# Patient Record
Sex: Female | Born: 1980 | Race: Black or African American | Hispanic: No | Marital: Single | Smoking: Never smoker
Health system: Southern US, Community
[De-identification: ages and names within clinical notes are randomized; demographics above are authoritative.]

## PROBLEM LIST (undated history)

## (undated) DIAGNOSIS — E119 Type 2 diabetes mellitus without complications: Secondary | ICD-10-CM

## (undated) DIAGNOSIS — I10 Essential (primary) hypertension: Secondary | ICD-10-CM

## (undated) HISTORY — DX: Morbid (severe) obesity due to excess calories: E66.01

---

## 2002-12-19 ENCOUNTER — Encounter: Admission: RE | Admit: 2002-12-19 | Discharge: 2002-12-19 | Payer: Self-pay | Admitting: Family Medicine

## 2003-01-02 ENCOUNTER — Encounter: Admission: RE | Admit: 2003-01-02 | Discharge: 2003-01-02 | Payer: Self-pay | Admitting: Family Medicine

## 2003-01-30 ENCOUNTER — Encounter: Admission: RE | Admit: 2003-01-30 | Discharge: 2003-01-30 | Payer: Self-pay | Admitting: Family Medicine

## 2006-11-28 ENCOUNTER — Ambulatory Visit (HOSPITAL_COMMUNITY): Admission: RE | Admit: 2006-11-28 | Discharge: 2006-11-28 | Payer: Self-pay | Admitting: Obstetrics and Gynecology

## 2006-12-26 ENCOUNTER — Ambulatory Visit (HOSPITAL_COMMUNITY): Admission: RE | Admit: 2006-12-26 | Discharge: 2006-12-26 | Payer: Self-pay | Admitting: Obstetrics and Gynecology

## 2006-12-26 IMAGING — US US OB FOLLOW-UP
1 series · 11 of 11 positions shown · non-contrast
Comparison: none

OBSTETRICAL ULTRASOUND:
 This ultrasound was performed in The [HOSPITAL], and the [facility] report will be stored to [REDACTED] PACS.

[Series 1: us ob follow-up · 11 of 11 slices shown]
[im 1/11]
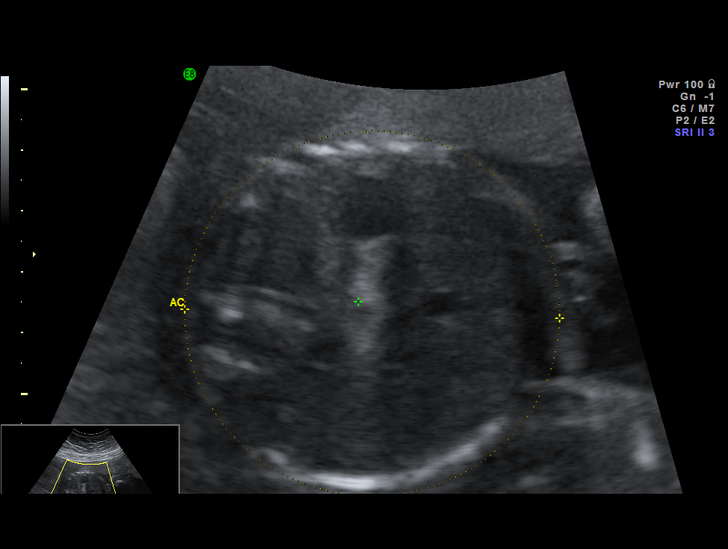
[im 2/11]
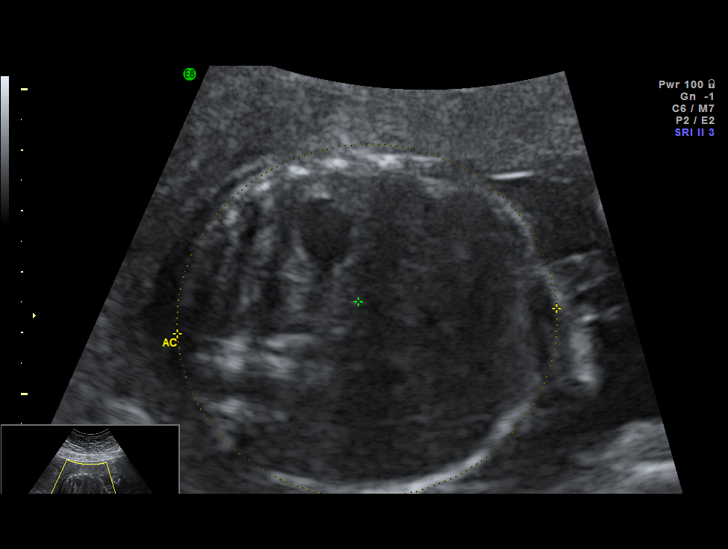
[im 3/11]
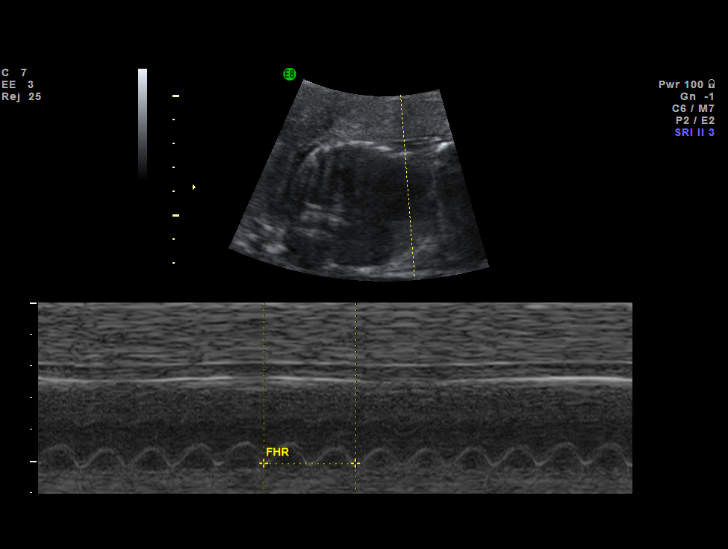
[im 4/11]
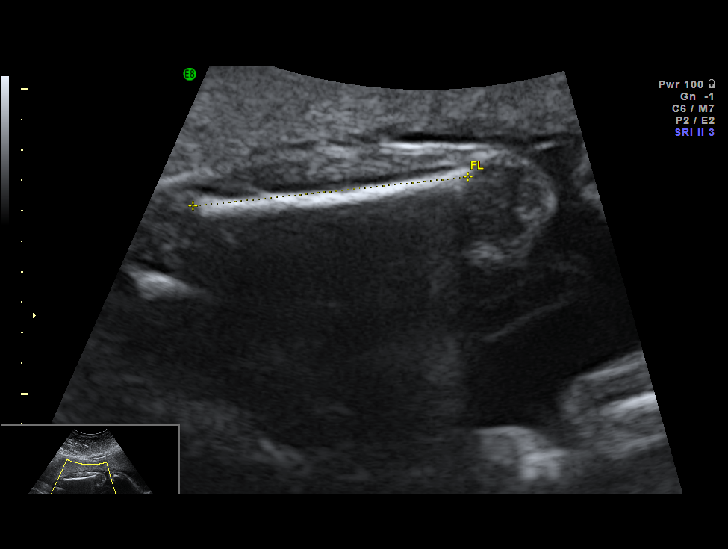
[im 5/11]
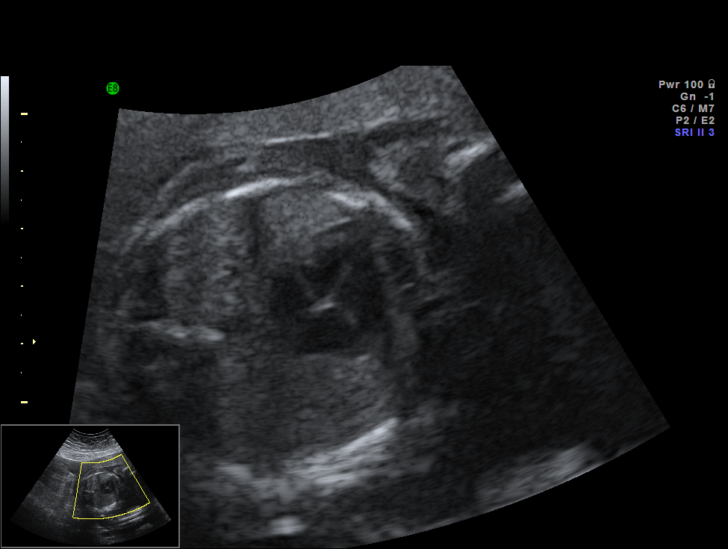
[im 6/11]
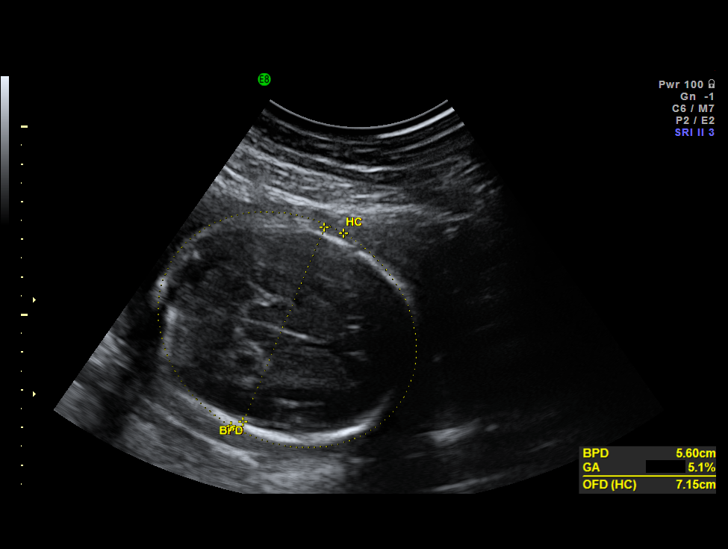
[im 7/11]
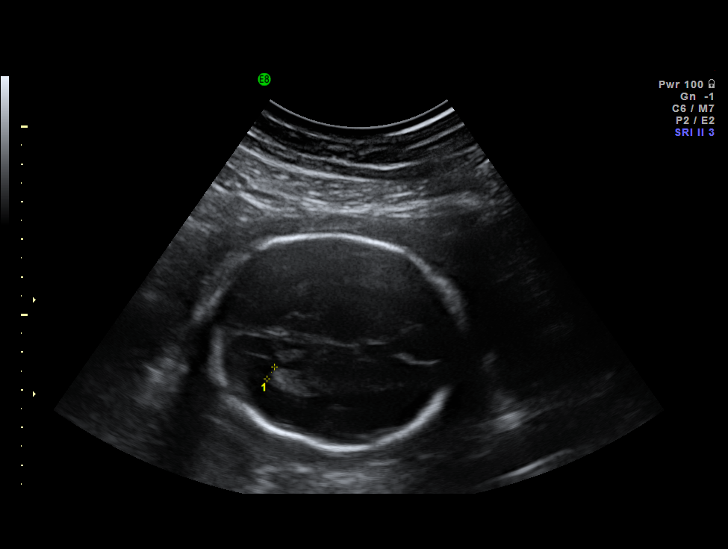
[im 8/11]
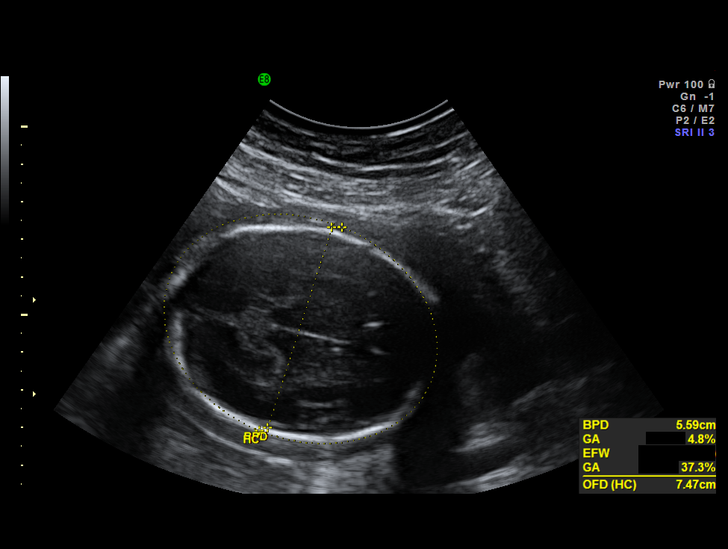
[im 9/11]
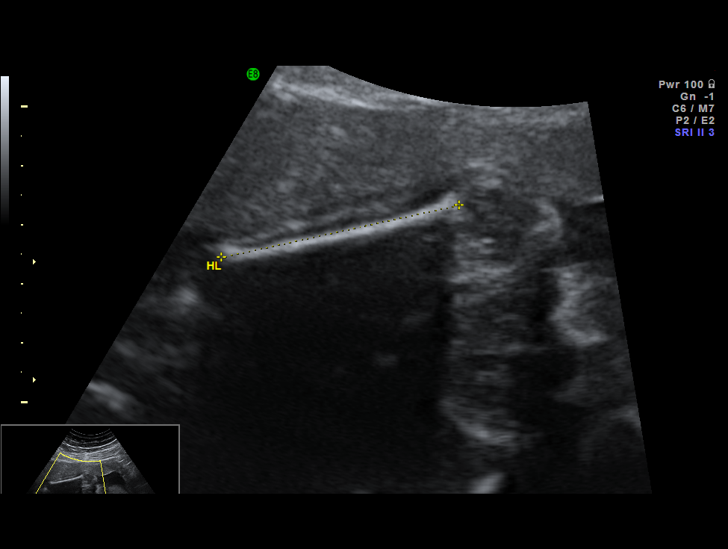
[im 10/11]
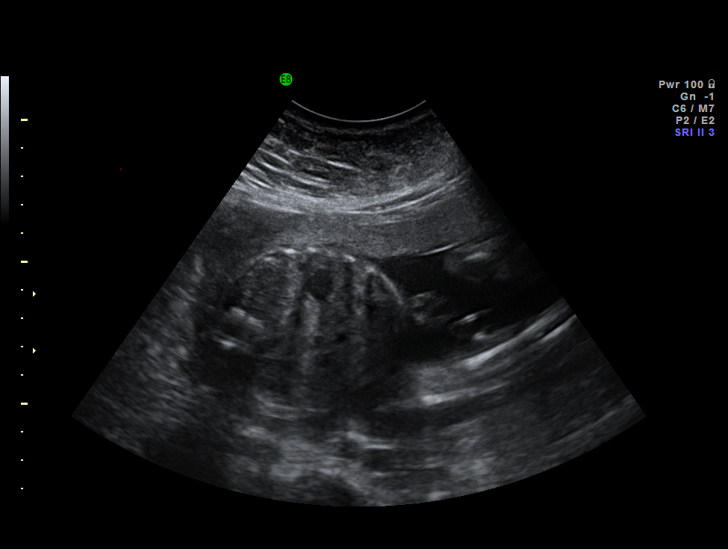
[im 11/11]
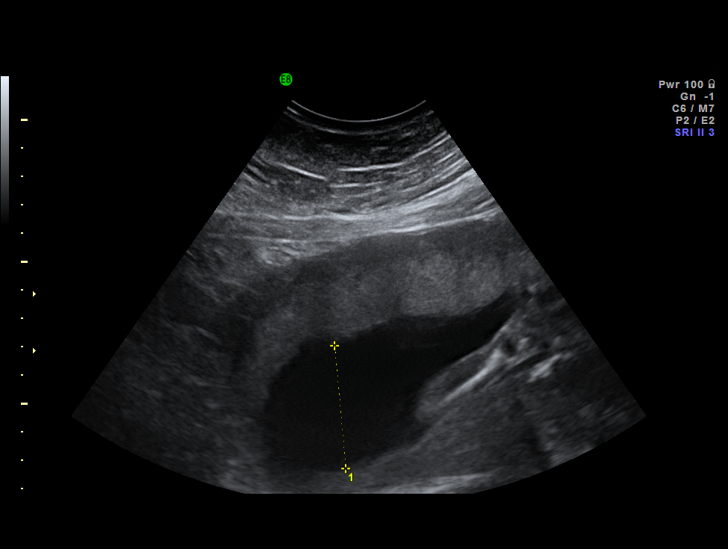

[11 of 11 positions shown; findings below may reference images not displayed]

IMPRESSION: The [facility] report has also been faxed to the ordering physician.

## 2007-01-30 ENCOUNTER — Ambulatory Visit (HOSPITAL_COMMUNITY): Admission: RE | Admit: 2007-01-30 | Discharge: 2007-01-30 | Payer: Self-pay | Admitting: Obstetrics and Gynecology

## 2007-02-20 ENCOUNTER — Ambulatory Visit (HOSPITAL_COMMUNITY): Admission: RE | Admit: 2007-02-20 | Discharge: 2007-02-20 | Payer: Self-pay | Admitting: Obstetrics and Gynecology

## 2007-02-22 ENCOUNTER — Ambulatory Visit (HOSPITAL_COMMUNITY): Admission: RE | Admit: 2007-02-22 | Discharge: 2007-02-22 | Payer: Self-pay | Admitting: Obstetrics and Gynecology

## 2007-02-26 ENCOUNTER — Ambulatory Visit (HOSPITAL_COMMUNITY): Admission: RE | Admit: 2007-02-26 | Discharge: 2007-02-26 | Payer: Self-pay | Admitting: Obstetrics and Gynecology

## 2007-03-07 ENCOUNTER — Ambulatory Visit (HOSPITAL_COMMUNITY): Admission: RE | Admit: 2007-03-07 | Discharge: 2007-03-07 | Payer: Self-pay | Admitting: Obstetrics and Gynecology

## 2007-03-12 ENCOUNTER — Ambulatory Visit (HOSPITAL_COMMUNITY): Admission: RE | Admit: 2007-03-12 | Discharge: 2007-03-12 | Payer: Self-pay | Admitting: Obstetrics and Gynecology

## 2007-03-19 ENCOUNTER — Ambulatory Visit (HOSPITAL_COMMUNITY): Admission: RE | Admit: 2007-03-19 | Discharge: 2007-03-19 | Payer: Self-pay | Admitting: Obstetrics and Gynecology

## 2007-03-26 ENCOUNTER — Ambulatory Visit (HOSPITAL_COMMUNITY): Admission: RE | Admit: 2007-03-26 | Discharge: 2007-03-26 | Payer: Self-pay | Admitting: Obstetrics and Gynecology

## 2010-06-19 ENCOUNTER — Encounter: Payer: Self-pay | Admitting: Obstetrics and Gynecology

## 2010-10-14 NOTE — Group Therapy Note (Signed)
   Pamela Trujillo, DORMER NO.:  [id number]   MEDICAL RECORD NO.:  [id number]                   PATIENT TYPE:  OUT   LOCATION:  [facility]                           FACILITY:  [facility]   PHYSICIAN:  Tinnie Gens, MD                     DATE OF BIRTH:  1981-05-26   DATE OF SERVICE:  01/02/2003                                    CLINIC NOTE   CHIEF COMPLAINT:  Abnormal Pap.   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   PHYSICAL EXAMINATION:  VITAL SIGNS:  As noted.  Please note blood pressure  139/97, weight 275 pounds.   PROCEDURE:  After informed consent, a Teflon-coated speculum was placed  inside the vagina.  A colposcopy was done.  There were several areas of  acetowhite noted at the 1 o'clock, 5 o'clock, 8 o'clock, and 10 o'clock  positions.  The 5 and 10 o'clock had previously been biopsied and were  proven to be CIN III.  After this cervix was circumferentially injected with  1% lidocaine with epinephrine.  After this a medium sized loop was used to  take out segments of cervix to include the abnormal portions.  The portion  of the cervix removed in two equal swipes across the anterior and posterior  lip to include the squamocolumnar junction.  The cervix was then cauterized  using the ball with electrocautery and then Monsel's used to obtain  excellent hemostasis.  All instruments were removed from the vagina.  The  patient tolerated procedure well.   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   PLAN:  Status post LEEP.  Will follow up in two weeks for review of  pathology.                                               Tinnie Gens, MD    TP/MEDQ  D:  01/02/2003  T:  01/02/2003  Job:  [id number]

## 2010-10-14 NOTE — Group Therapy Note (Signed)
Pamela Trujillo, Pamela Trujillo NO.:  [id number]   MEDICAL RECORD NO.:  [id number]                   PATIENT TYPE:  OUT   LOCATION:  [facility]                           FACILITY:  [facility]   PHYSICIAN:  Tinnie Gens, M.D.                   DATE OF BIRTH:  06-11-1980   DATE OF SERVICE:  12/19/2002                                    CLINIC NOTE   CHIEF COMPLAINT:  Abnormal Pap smear.   HISTORY OF PRESENT ILLNESS:  The patient is a 30 year old gravida 1, para 1  who comes in today for abnormal Pap.  Apparently, she had her Pap smear and  colposcopy done at the [facility].  Her Pap showed low  grade SIL.  Her colposcopy results was CIN III.  She comes in today for a  LEEP evaluation.   PAST MEDICAL HISTORY:  Asthma diagnosed several years ago.  She has an  inhaler to be used as needed, but does not use it frequently.   PAST SURGICAL HISTORY:  Negative.   PAST OBSTETRICAL HISTORY:  She is a G1, P1, previous vaginal delivery.   PAST GYN HISTORY:  She has no periods secondary to Depo use.  She has no  STDs.  Her Pap smears are as outlined in the HPI.  She has had four sexual  partners.   FAMILY HISTORY:  Significant for diabetes in a mother and a sister.   SOCIAL HISTORY:  She does work outside the home.  She is a Production designer, theatre/television/film at [employer]  She does not drink or smoke tobacco.  She denies other drug use.   ALLERGIES:  None.   MEDICATIONS:  None.   REVIEW OF SYSTEMS:  Negative for fevers, chills, weight loss or weight gain,  headaches, fatigue, dizzy spells, chest pain, shortness of breath, coughing  up blood, nausea, vomiting, diarrhea, constipation, blood in her stools,  problems with urination, vaginal symptoms.   PHYSICAL EXAMINATION:  GENERAL:  She is an obese female in no acute  distress.  VITAL SIGNS:  Blood pressure 140/98 today, weight 275 pounds, height 5 feet  4 inches, pulse 86.  PELVIC:  She has normal external female genitalia.  The  vagina is rugated  and clean.  Cervix is visualized easily and parous.  There are no visible  lesions on the cervix.   IMPRESSION:  CIN III.   PLAN:  LEEP procedure here in the office in two to three weeks.  She will  view the video for that today.  Discussed abnormal Paps and need for follow-  up, progression of cancer, HPV, and sexual transmission.  Will continue to  monitor this patient's blood pressure and probably could use a screening for  diabetes as she is markedly obese and has such a strong family history.  Will take this up at her next visit.  Tinnie Gens, M.D.    TP/MEDQ  D:  12/19/2002  T:  12/19/2002  Job:  [id number]

## 2011-06-20 ENCOUNTER — Other Ambulatory Visit: Payer: Self-pay | Admitting: Obstetrics and Gynecology

## 2011-06-20 ENCOUNTER — Other Ambulatory Visit (HOSPITAL_COMMUNITY)
Admission: RE | Admit: 2011-06-20 | Discharge: 2011-06-20 | Disposition: A | Discharge status: Home / Self Care | Payer: PRIVATE HEALTH INSURANCE | Source: Ambulatory Visit | Attending: Obstetrics and Gynecology | Admitting: Obstetrics and Gynecology

## 2011-06-20 DIAGNOSIS — Z124 Encounter for screening for malignant neoplasm of cervix: Secondary | ICD-10-CM | POA: Insufficient documentation

## 2011-06-20 DIAGNOSIS — Z113 Encounter for screening for infections with a predominantly sexual mode of transmission: Secondary | ICD-10-CM | POA: Insufficient documentation

## 2011-06-20 DIAGNOSIS — R87810 Cervical high risk human papillomavirus (HPV) DNA test positive: Secondary | ICD-10-CM | POA: Insufficient documentation

## 2012-10-13 ENCOUNTER — Emergency Department (HOSPITAL_BASED_OUTPATIENT_CLINIC_OR_DEPARTMENT_OTHER)
Admission: EM | Admit: 2012-10-13 | Discharge: 2012-10-13 | Disposition: A | Discharge status: Home / Self Care | Payer: PRIVATE HEALTH INSURANCE | Attending: Emergency Medicine | Admitting: Emergency Medicine

## 2012-10-13 ENCOUNTER — Emergency Department (HOSPITAL_BASED_OUTPATIENT_CLINIC_OR_DEPARTMENT_OTHER): Payer: PRIVATE HEALTH INSURANCE

## 2012-10-13 ENCOUNTER — Encounter (HOSPITAL_BASED_OUTPATIENT_CLINIC_OR_DEPARTMENT_OTHER): Payer: Self-pay | Admitting: *Deleted

## 2012-10-13 DIAGNOSIS — J3489 Other specified disorders of nose and nasal sinuses: Secondary | ICD-10-CM | POA: Insufficient documentation

## 2012-10-13 DIAGNOSIS — J069 Acute upper respiratory infection, unspecified: Secondary | ICD-10-CM | POA: Insufficient documentation

## 2012-10-13 DIAGNOSIS — R6889 Other general symptoms and signs: Secondary | ICD-10-CM | POA: Insufficient documentation

## 2012-10-13 DIAGNOSIS — H938X9 Other specified disorders of ear, unspecified ear: Secondary | ICD-10-CM | POA: Insufficient documentation

## 2012-10-13 MED ORDER — PREDNISONE 50 MG PO TABS: 50.0000 mg | ORAL_TABLET | Freq: Every day | ORAL | Status: DC

## 2012-10-13 MED ORDER — ACETAMINOPHEN-CODEINE 120-12 MG/5ML PO SOLN: 10.0000 mL | ORAL | Status: DC | PRN

## 2012-10-13 MED ORDER — GUAIFENESIN ER 1200 MG PO TB12: 1.0000 | ORAL_TABLET | Freq: Two times a day (BID) | ORAL | Status: DC

## 2012-10-13 NOTE — ED Provider Notes (Signed)
Medical screening examination/treatment/procedure(s) were performed by non-physician practitioner and as supervising physician I was immediately available for consultation/collaboration.   Mackenzi Krogh B. Bernette Mayers, MD 10/13/12 2154

## 2012-10-13 NOTE — ED Notes (Signed)
Pt reports nasal drainage and congestion that began 10/10/12, drainage reported clear/green and occasionally brown - pt states has gotten progressively worse, yesterday became febrile and has been alternating ibuprofen and tylenol. Pt also developed rt ear pain yesterday. Pt states only pain at present is rt ear pain 5/10 - LS clear and equal bilat. No acute distress, resp even and unlabored.

## 2012-10-13 NOTE — ED Notes (Signed)
Pt states she has had cold s/s since Thursday.

## 2012-10-13 NOTE — ED Notes (Signed)
Pt ambulating independently w/ steady gait on d/c in no acute distress, A&Ox4. D/c instructions reviewed w/ pt - pt denies any further questions or concerns at present. Rx given x3  

## 2012-10-13 NOTE — ED Provider Notes (Signed)
History     CSN: [id number]  Arrival date & time 10/13/12  4098   First MD Initiated Contact with Patient 10/13/12 2006      Chief Complaint  Patient presents with  . URI    (Consider location/radiation/quality/duration/timing/severity/associated sxs/prior treatment) HPI Patient presents emergency department with cough, nasal congestion, and runny nose.  Patient, states began 2 days, ago.  Patient denies chest pain, shortness of breath, nausea, vomiting, abdominal pain, dysuria, back pain, blurred vision, headache, dizziness, or syncope.  Patient, states, that she did not take anything prior to arrival.  Patient, states, that nothing seems make her condition, better or worse History reviewed. No pertinent past medical history.  Past Surgical History  Procedure Laterality Date  . Cesarean section      History reviewed. No pertinent family history.  History  Substance Use Topics  . Smoking status: Never Smoker   . Smokeless tobacco: Not on file  . Alcohol Use: No    OB History   Grav Para Term Preterm Abortions TAB SAB Ect Mult Living                  Review of Systems All other systems negative except as documented in the HPI. All pertinent positives and negatives as reviewed in the HPI. Allergies  Review of patient's allergies indicates no known allergies.  Home Medications  No current outpatient prescriptions on file.  BP 169/94  Pulse 112  Temp(Src) 98.6 F (37 C) (Oral)  Resp 20  Ht 5\' 4"  (1.626 m)  Wt 286 lb (129.729 kg)  BMI 49.07 kg/m2  SpO2 100%  Physical Exam  Nursing note and vitals reviewed. Constitutional: She is oriented to person, place, and time. She appears well-developed and well-nourished. No distress.  HENT:  Head: Normocephalic and atraumatic.  Right Ear: A middle ear effusion is present.  Left Ear: A middle ear effusion is present.  Nose: Mucosal edema and rhinorrhea present. No nasal deformity. No epistaxis.  Mouth/Throat: Uvula is  midline, oropharynx is clear and moist and mucous membranes are normal. Mucous membranes are not pale and not dry. No oropharyngeal exudate.  Eyes: Pupils are equal, round, and reactive to light.  Neck: Normal range of motion. Neck supple.  Cardiovascular: Normal rate, regular rhythm and normal heart sounds.   Pulmonary/Chest: Effort normal and breath sounds normal. No respiratory distress. She has no wheezes. She has no rales.  Neurological: She is alert and oriented to person, place, and time. Coordination normal.  Skin: Skin is warm. No rash noted.    ED Course  Procedures (including critical care time)  Labs Reviewed - No data to display Dg Chest 2 View  10/13/2012   *RADIOLOGY REPORT*  Clinical Data: Cough and fever.  CHEST - 2 VIEW  Comparison:  None.  Findings:  The heart size and mediastinal contours are within normal limits.  Both lungs are clear.  The visualized skeletal structures are unremarkable.  IMPRESSION: No active cardiopulmonary disease.   Original Report Authenticated By: Myles Rosenthal, M.D.    Patient be treated for URI symptoms.  She is told to return here for any worsening in her condition.  Advised to increase her fluid intake, and rest as much as possible   MDM         Carlyle Dolly, PA-C 10/13/12 2153

## 2015-04-04 ENCOUNTER — Encounter (HOSPITAL_BASED_OUTPATIENT_CLINIC_OR_DEPARTMENT_OTHER): Payer: Self-pay

## 2015-04-04 ENCOUNTER — Emergency Department (HOSPITAL_BASED_OUTPATIENT_CLINIC_OR_DEPARTMENT_OTHER)
Admission: EM | Admit: 2015-04-04 | Discharge: 2015-04-04 | Disposition: A | Discharge status: Home / Self Care | Payer: PRIVATE HEALTH INSURANCE | Attending: Emergency Medicine | Admitting: Emergency Medicine

## 2015-04-04 DIAGNOSIS — Z79899 Other long term (current) drug therapy: Secondary | ICD-10-CM | POA: Insufficient documentation

## 2015-04-04 DIAGNOSIS — R05 Cough: Secondary | ICD-10-CM | POA: Diagnosis present

## 2015-04-04 DIAGNOSIS — J029 Acute pharyngitis, unspecified: Secondary | ICD-10-CM

## 2015-04-04 DIAGNOSIS — J209 Acute bronchitis, unspecified: Secondary | ICD-10-CM | POA: Diagnosis not present

## 2015-04-04 DIAGNOSIS — Z7951 Long term (current) use of inhaled steroids: Secondary | ICD-10-CM | POA: Insufficient documentation

## 2015-04-04 LAB — RAPID STREP SCREEN (MED CTR MEBANE ONLY): STREPTOCOCCUS, GROUP A SCREEN (DIRECT): NEGATIVE

## 2015-04-04 MED ORDER — HYDROCODONE-HOMATROPINE 5-1.5 MG/5ML PO SYRP: 5.0000 mL | ORAL_SOLUTION | Freq: Four times a day (QID) | ORAL | Status: DC | PRN

## 2015-04-04 MED ORDER — PREDNISONE 20 MG PO TABS: ORAL_TABLET | ORAL | Status: DC

## 2015-04-04 MED ORDER — ACETAMINOPHEN 500 MG PO TABS: 1000.0000 mg | ORAL_TABLET | Freq: Four times a day (QID) | ORAL | Status: DC | PRN

## 2015-04-04 MED ORDER — ALBUTEROL SULFATE HFA 108 (90 BASE) MCG/ACT IN AERS: 2.0000 | INHALATION_SPRAY | RESPIRATORY_TRACT | Status: DC | PRN

## 2015-04-04 MED ORDER — AEROCHAMBER PLUS W/MASK MISC: Status: DC

## 2015-04-04 NOTE — Discharge Instructions (Signed)
Acute Bronchitis Bronchitis is inflammation of the airways that extend from the windpipe into the lungs (bronchi). The inflammation often causes mucus to develop. This leads to a cough, which is the most common symptom of bronchitis.  In acute bronchitis, the condition usually develops suddenly and goes away over time, usually in a couple weeks. Smoking, allergies, and asthma can make bronchitis worse. Repeated episodes of bronchitis may cause further lung problems.  CAUSES Acute bronchitis is most often caused by the same virus that causes a cold. The virus can spread from person to person (contagious) through coughing, sneezing, and touching contaminated objects. SIGNS AND SYMPTOMS   Cough.   Fever.   Coughing up mucus.   Body aches.   Chest congestion.   Chills.   Shortness of breath.   Sore throat.  DIAGNOSIS  Acute bronchitis is usually diagnosed through a physical exam. Your health care provider will also ask you questions about your medical history. Tests, such as chest X-rays, are sometimes done to rule out other conditions.  TREATMENT  Acute bronchitis usually goes away in a couple weeks. Oftentimes, no medical treatment is necessary. Medicines are sometimes given for relief of fever or cough. Antibiotic medicines are usually not needed but may be prescribed in certain situations. In some cases, an inhaler may be recommended to help reduce shortness of breath and control the cough. A cool mist vaporizer may also be used to help thin bronchial secretions and make it easier to clear the chest.  HOME CARE INSTRUCTIONS  Get plenty of rest.   Drink enough fluids to keep your urine clear or pale yellow (unless you have a medical condition that requires fluid restriction). Increasing fluids may help thin your respiratory secretions (sputum) and reduce chest congestion, and it will prevent dehydration.   Take medicines only as directed by your health care provider.  If  you were prescribed an antibiotic medicine, finish it all even if you start to feel better.  Avoid smoking and secondhand smoke. Exposure to cigarette smoke or irritating chemicals will make bronchitis worse. If you are a smoker, consider using nicotine gum or skin patches to help control withdrawal symptoms. Quitting smoking will help your lungs heal faster.   Reduce the chances of another bout of acute bronchitis by washing your hands frequently, avoiding people with cold symptoms, and trying not to touch your hands to your mouth, nose, or eyes.   Keep all follow-up visits as directed by your health care provider.  SEEK MEDICAL CARE IF: Your symptoms do not improve after 1 week of treatment.  SEEK IMMEDIATE MEDICAL CARE IF:  You develop an increased fever or chills.   You have chest pain.   You have severe shortness of breath.  You have bloody sputum.   You develop dehydration.  You faint or repeatedly feel like you are going to pass out.  You develop repeated vomiting.  You develop a severe headache. MAKE SURE YOU:   Understand these instructions.  Will watch your condition.  Will get help right away if you are not doing well or get worse.   This information is not intended to replace advice given to you by your health care provider. Make sure you discuss any questions you have with your health care provider.   Document Released: 06/22/2004 Document Revised: 06/05/2014 Document Reviewed: 11/05/2012 Elsevier Interactive Patient Education 2016 Else[employer] Inc[employer]  Pharyngitis Pharyngitis is redness, pain, and swelling (inflammation) of your pharynx.  CAUSES  Pharyngitis is usually caused  by infection. Most of the time, these infections are from viruses (viral) and are part of a cold. However, sometimes pharyngitis is caused by bacteria (bacterial). Pharyngitis can also be caused by allergies. Viral pharyngitis may be spread from person to person by coughing, sneezing, and  personal items or utensils (cups, forks, spoons, toothbrushes). Bacterial pharyngitis may be spread from person to person by more intimate contact, such as kissing.  °SIGNS AND SYMPTOMS  °Symptoms of pharyngitis include:   °· Sore throat.   °· Tiredness (fatigue).   °· Low-grade fever.   °· Headache. °· Joint pain and muscle aches. °· Skin rashes. °· Swollen lymph nodes. °· Plaque-like film on throat or tonsils (often seen with bacterial pharyngitis). °DIAGNOSIS  °Your health care provider will ask you questions about your illness and your symptoms. Your medical history, along with a physical exam, is often all that is needed to diagnose pharyngitis. Sometimes, a rapid strep test is done. Other lab tests may also be done, depending on the suspected cause.  °TREATMENT  °Viral pharyngitis will usually get better in 3-4 days without the use of medicine. Bacterial pharyngitis is treated with medicines that kill germs (antibiotics).  °HOME CARE INSTRUCTIONS  °· Drink enough water and fluids to keep your urine clear or pale yellow.   °· Only take over-the-counter or prescription medicines as directed by your health care provider:   °¨ If you are prescribed antibiotics, make sure you finish them even if you start to feel better.   °¨ Do not take aspirin.   °· Get lots of rest.   °· Gargle with 8 oz of salt water (½ tsp of salt per 1 qt of water) as often as every 1-2 hours to soothe your throat.   °· Throat lozenges (if you are not at risk for choking) or sprays may be used to soothe your throat. °SEEK MEDICAL CARE IF:  °· You have large, tender lumps in your neck. °· You have a rash. °· You cough up green, yellow-brown, or bloody spit. °SEEK IMMEDIATE MEDICAL CARE IF:  °· Your neck becomes stiff. °· You drool or are unable to swallow liquids. °· You vomit or are unable to keep medicines or liquids down. °· You have severe pain that does not go away with the use of recommended medicines. °· You have trouble breathing (not  caused by a stuffy nose). °MAKE SURE YOU:  °· Understand these instructions. °· Will watch your condition. °· Will get help right away if you are not doing well or get worse. °  °This information is not intended to replace advice given to you by your health care provider. Make sure you discuss any questions you have with your health care provider. °  °Document Released: 05/15/2005 Document Revised: 03/05/2013 Document Reviewed: 01/20/2013 °Elsevier Interactive Patient Education ©2016 Elsevier Inc. ° °

## 2015-04-04 NOTE — ED Notes (Signed)
Pt reports 2 day history of prod cough with yellow sputum that is thin, sore throat, and nasal congestion. Reports Mucinex was ineffective. 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

## 2015-04-04 NOTE — ED Provider Notes (Signed)
CSN: [id number]     Arrival date & time 04/04/15  1628 History   First MD Initiated Contact with Patient 04/04/15 1854     Chief Complaint  Patient presents with  . Cough     (Consider location/radiation/quality/duration/timing/severity/associated sxs/prior Treatment) HPI Patient has symptoms for 2 days. She reports she has a sore throat. There is nighttime cough. No associated fever. No significant nasal congestion or drainage. She does report yellow sputum production. No associated vomiting or diarrhea. No tobacco history. History reviewed. No pertinent past medical history. Past Surgical History  Procedure Laterality Date  . Cesarean section     History reviewed. No pertinent family history. Social History  Substance Use Topics  . Smoking status: Never Smoker   . Smokeless tobacco: None  . Alcohol Use: No   OB History    No data available     Review of Systems Constitutional: No fever chills or constitutional illness GI: No nausea vomiting diarrhea or abdominal pain   Allergies  Review of patient's allergies indicates no known allergies.  Home Medications   Prior to Admission medications   Medication Sig Start Date End Date Taking? Authorizing Provider  acetaminophen (TYLENOL) 500 MG tablet Take 2 tablets (1,000 mg total) by mouth every 6 (six) hours as needed. 04/04/15   Arby BarretteMarcy Porchia Sinkler, MD  acetaminophen-codeine 120-12 MG/5ML solution Take 10 mLs by mouth every 4 (four) hours as needed for pain. 10/13/12   Charlestine Nighthristopher Lawyer, PA-C  albuterol (PROVENTIL HFA;VENTOLIN HFA) 108 (90 BASE) MCG/ACT inhaler Inhale 2 puffs into the lungs every 4 (four) hours as needed for wheezing or shortness of breath. 04/04/15   Arby BarretteMarcy Nelli Swalley, MD  Guaifenesin 1200 MG TB12 Take 1 tablet (1,200 mg total) by mouth 2 (two) times daily. 10/13/12   Christopher Lawyer, PA-C  HYDROcodone-homatropine (HYCODAN) 5-1.5 MG/5ML syrup Take 5 mLs by mouth every 6 (six) hours as needed for cough. 04/04/15    Arby BarretteMarcy Quita Mcgrory, MD  predniSONE (DELTASONE) 20 MG tablet 3 tabs po day one, then 2 po daily x 4 days 04/04/15   Arby BarretteMarcy Zarahi Fuerst, MD  predniSONE (DELTASONE) 50 MG tablet Take 1 tablet (50 mg total) by mouth daily. 10/13/12   Charlestine Nighthristopher Lawyer, PA-C  Spacer/Aero-Holding Chambers (AEROCHAMBER PLUS WITH MASK) inhaler Use as instructed 04/04/15   Arby BarretteMarcy Eirene Rather, MD   BP 158/96 mmHg  Pulse 100  Temp(Src) 98.4 F (36.9 C) (Oral)  Resp 18  Ht 5\' 3"  (1.6 m)  Wt 280 lb (127.007 kg)  BMI 49.61 kg/m2  SpO2 100%  LMP 03/08/2015 Physical Exam  Constitutional: She is oriented to person, place, and time.  Morbid obesity. No respiratory distress. Alert and otherwise well in appearance.  HENT:  Head: Normocephalic and atraumatic.  Right Ear: External ear normal.  Left Ear: External ear normal.  Nose: Nose normal.  Mouth/Throat: Oropharynx is clear and moist. No oropharyngeal exudate.  No tonsillar erythema or exudate. Posterior oropharynx is widely patent.  Eyes: EOM are normal. Pupils are equal, round, and reactive to light.  Neck: Neck supple.  Cardiovascular: Normal rate, regular rhythm, normal heart sounds and intact distal pulses.   Pulmonary/Chest: Effort normal. She has wheezes.  Patient does have intermittent dry cough. There is occasional expiratory wheeze. No wrist or distress.  Musculoskeletal: Normal range of motion.  Neurological: She is alert and oriented to person, place, and time. She has normal strength. No cranial nerve deficit. She exhibits normal muscle tone. Coordination normal. GCS eye subscore is 4. GCS verbal subscore is  5. GCS motor subscore is 6.  Skin: Skin is warm, dry and intact.  Psychiatric: She has a normal mood and affect.    ED Course  Procedures (including critical care time) Labs Review Labs Reviewed  RAPID STREP SCREEN (NOT AT [facility])  CULTURE, GROUP A STREP    Imaging Review No results found. I have personally reviewed and evaluated these images and lab  results as part of my medical decision-making.   EKG Interpretation None      MDM   Final diagnoses:  Acute bronchitis, unspecified organism  Pharyngitis   Patient has had 2 days of symptoms. She reports cough and sore throat. Chest auscultation does have scattered wheeze present. She does not have risk for distress. The patient will be treated symptomatically for bronchitis with pharyngitis. She has a family physician. She is advised to follow up in approximately 5-7 days.    Arby Barrette, MD 04/04/15 [phone]

## 2015-04-04 NOTE — ED Notes (Signed)
Called patient for triage, pt outside.

## 2015-04-07 LAB — CULTURE, GROUP A STREP

## 2015-07-23 ENCOUNTER — Non-Acute Institutional Stay (SKILLED_NURSING_FACILITY): Payer: PRIVATE HEALTH INSURANCE | Admitting: Internal Medicine

## 2015-07-23 DIAGNOSIS — S32009D Unspecified fracture of unspecified lumbar vertebra, subsequent encounter for fracture with routine healing: Secondary | ICD-10-CM | POA: Diagnosis not present

## 2015-07-23 DIAGNOSIS — K5901 Slow transit constipation: Secondary | ICD-10-CM

## 2015-07-23 DIAGNOSIS — R52 Pain, unspecified: Secondary | ICD-10-CM

## 2015-07-23 DIAGNOSIS — R7881 Bacteremia: Secondary | ICD-10-CM

## 2015-07-23 DIAGNOSIS — I2699 Other pulmonary embolism without acute cor pulmonale: Secondary | ICD-10-CM

## 2015-07-23 DIAGNOSIS — S72401E Unspecified fracture of lower end of right femur, subsequent encounter for open fracture type I or II with routine healing: Secondary | ICD-10-CM

## 2015-07-23 DIAGNOSIS — S82831E Other fracture of upper and lower end of right fibula, subsequent encounter for open fracture type I or II with routine healing: Secondary | ICD-10-CM | POA: Diagnosis not present

## 2015-07-23 DIAGNOSIS — T80919D Hemolytic transfusion reaction, unspecified incompatibility, unspecified as acute or delayed, subsequent encounter: Secondary | ICD-10-CM | POA: Diagnosis not present

## 2015-07-23 DIAGNOSIS — R0689 Other abnormalities of breathing: Secondary | ICD-10-CM

## 2015-07-23 DIAGNOSIS — Z5189 Encounter for other specified aftercare: Secondary | ICD-10-CM

## 2015-07-23 DIAGNOSIS — D62 Acute posthemorrhagic anemia: Secondary | ICD-10-CM

## 2015-07-23 DIAGNOSIS — S81001D Unspecified open wound, right knee, subsequent encounter: Secondary | ICD-10-CM
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
## 2015-07-25 ENCOUNTER — Encounter: Payer: Self-pay | Admitting: Internal Medicine

## 2015-07-25 DIAGNOSIS — K5900 Constipation, unspecified: Secondary | ICD-10-CM | POA: Insufficient documentation

## 2015-07-25 DIAGNOSIS — R7881 Bacteremia: Secondary | ICD-10-CM | POA: Insufficient documentation

## 2015-07-25 DIAGNOSIS — S82839B Other fracture of upper and lower end of unspecified fibula, initial encounter for open fracture type I or II: Secondary | ICD-10-CM | POA: Insufficient documentation

## 2015-07-25 DIAGNOSIS — S72401B Unspecified fracture of lower end of right femur, initial encounter for open fracture type I or II: Secondary | ICD-10-CM | POA: Insufficient documentation

## 2015-07-25 DIAGNOSIS — D62 Acute posthemorrhagic anemia: Secondary | ICD-10-CM | POA: Insufficient documentation

## 2015-07-25 DIAGNOSIS — R52 Pain, unspecified: Secondary | ICD-10-CM | POA: Insufficient documentation

## 2015-07-25 DIAGNOSIS — S81001A Unspecified open wound, right knee, initial encounter: Secondary | ICD-10-CM | POA: Insufficient documentation

## 2015-07-25 DIAGNOSIS — R0689 Other abnormalities of breathing: Secondary | ICD-10-CM | POA: Insufficient documentation

## 2015-07-25 DIAGNOSIS — S32009A Unspecified fracture of unspecified lumbar vertebra, initial encounter for closed fracture: Secondary | ICD-10-CM | POA: Insufficient documentation

## 2015-07-25 DIAGNOSIS — T80919A Hemolytic transfusion reaction, unspecified incompatibility, unspecified as acute or delayed, initial encounter: Secondary | ICD-10-CM | POA: Insufficient documentation

## 2015-07-25 DIAGNOSIS — I2699 Other pulmonary embolism without acute cor pulmonale: Secondary | ICD-10-CM | POA: Insufficient documentation

## 2015-07-25 DIAGNOSIS — B9620 Unspecified Escherichia coli [E. coli] as the cause of diseases classified elsewhere: Secondary | ICD-10-CM | POA: Insufficient documentation

## 2015-07-25 NOTE — Assessment & Plan Note (Addendum)
Pt was d/c with the following instructions which is a new approach. I have also spoken to pt. Of note pt has no h/o addiction or ETOH in family which decreases her chances of addiction. Will monitor pt for any problems with decreasing medications  Please be aware that these drugs are addictive if abused. Please take medications as prescribed. Medication have been designed to have you discontinued from this medications for your safety. Please understand we will not replace stolen or lost prescription. It important to secure your medication as they are your responsibility.   MS Contin is your long acting pain medication. You may continue to take your short acting pain medication as prescribed while weaning.  MS contin taper: Take 1 tab po every 12 hours scheduled until 2/25 x2 Not to exceed 2 tabs per day  Take 1 tab po daily x 1 week then stop As not exceed 1 tab per day  Use the following for weaning from Tramadol: Tramadol  Week 1 take tab Q6 PRN do not exceed 4 tabs in a day  Week 2 take Q8 PRN do not exceed 3 tabs in a day Week 3 take Q 12 PRN do not exceed 2 tabs per day Week 4 tab po daily and stop   Weaning Percocet  Week One: Take 1-2 tablets every 6 hours as needed for pain. Maximum 8 tablets in 24 hour period.  Week Two: Take 1-2 tablets every 6 hours as needed for pain. Maximum 6 tablets in 24 hour period.  Week Three: Take 1 tablet every 6 hours as needed for pain. Maximum 4 tablets in 24 hour period.  Week Four: Take 1 tablet every 8 hours as needed for pain. Maximum 3 tablets in 24 hour period.  Week Five: Take 1 tablet every 12 hours as needed for pain. Maximum 2 tablets in 24 hour period.  Week Six: Take 1 tablet daily as needed for pain. Maximum 1 tablet in 24 hour period. Weaning Percocet  Week One: Take 1-2 tablets every 6 hours as needed for pain. Maximum 8 tablets in 24 hour period.  Week Two: Take 1-2 tablets every 6 hours as needed for pain. Maximum 6 tablets in 24 hour  period.  Week Three: Take 1 tablet every 6 hours as needed for pain. Maximum 4 tablets in 24 hour period.  Week Four: Take 1 tablet every 8 hours as needed for pain. Maximum 3 tablets in 24 hour period.  Week Five: Take 1 tablet every 12 hours as needed for pain. Maximum 2 tablets in 24 hour period.  Week Six: Take 1 tablet daily as needed for pain. Maximum 1 tablet in 24 hour period.

## 2015-07-25 NOTE — Assessment & Plan Note (Signed)
Pt underwent IM nailing with good result; SNF - admitted for OT/PT

## 2015-07-25 NOTE — Assessment & Plan Note (Addendum)
develop RUL PE that required Heparin, treatment doses of Lovenox with Coumadin bridge therapy. She will require INR monitoring. [facility] - coumadin will be actively titrated

## 2015-07-25 NOTE — Assessment & Plan Note (Signed)
Managed with pain manangment

## 2015-07-25 NOTE — Assessment & Plan Note (Signed)
SNF - calorie appropriate diet with supplements for wound and bone healing
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
## 2015-07-25 NOTE — Assessment & Plan Note (Signed)
SNF - cont miralax and senna-s

## 2015-07-25 NOTE — Assessment & Plan Note (Signed)
SNF - the lowest Hb I saw was 6.0; pt was transfused multiple times; it looks like her d/c Hb was 7.7; will follow CBC

## 2015-07-25 NOTE — Assessment & Plan Note (Signed)
Felt s/s to knee wound. Pt was treated with IV antibiotics then oral cipro; SNF - pt is d/c on oral cipro 750 mg q 12 until 08/01/2015

## 2015-07-25 NOTE — Assessment & Plan Note (Signed)
Patient was transfused blood and suffered a hemolytic transfusion reaction. She was given tylenol and recovered well. SNF -will follow CBC

## 2015-07-25 NOTE — Assessment & Plan Note (Signed)
Tx surgically with femur fracture

## 2015-07-25 NOTE — Assessment & Plan Note (Signed)
Medial aspect of the knee developed a  Blister with daily dressing changes.The pt triggered multiple code sepsis with neg U/A and CXR. The knee was suspected to be infected and blod cx were + for E. Coli. Pt started on IV antibiotics and transitioned to cipro; SNF - oral cipro until 08/01/2015.

## 2015-07-26 ENCOUNTER — Other Ambulatory Visit: Payer: Self-pay | Admitting: Internal Medicine

## 2015-07-26 DIAGNOSIS — M25562 Pain in left knee: Principal | ICD-10-CM

## 2015-07-26 DIAGNOSIS — G8929 Other chronic pain: Secondary | ICD-10-CM

## 2015-07-26 LAB — CBC AND DIFFERENTIAL
HEMATOCRIT: 26 % — AB (ref 36–46)
Hemoglobin: 8.1 g/dL — AB (ref 12.0–16.0)
Platelets: 716 10*3/uL — AB (ref 150–399)
WBC: 8.7 10^3/mL

## 2015-07-26 LAB — HEPATIC FUNCTION PANEL
ALK PHOS: 108 U/L (ref 25–125)
ALT: 19 U/L (ref 7–35)
AST: 18 U/L (ref 13–35)
BILIRUBIN, TOTAL: 0.5 mg/dL

## 2015-07-26 LAB — BASIC METABOLIC PANEL
BUN: 8 mg/dL (ref 4–21)
Creatinine: 0.5 mg/dL (ref 0.5–1.1)
Glucose: 85 mg/dL
Potassium: 4.4 mmol/L (ref 3.4–5.3)
SODIUM: 139 mmol/L (ref 137–147)

## 2015-07-30 ENCOUNTER — Ambulatory Visit (HOSPITAL_COMMUNITY): Payer: PRIVATE HEALTH INSURANCE

## 2015-08-02 ENCOUNTER — Ambulatory Visit (HOSPITAL_COMMUNITY): Admission: RE | Admit: 2015-08-02 | Payer: PRIVATE HEALTH INSURANCE | Source: Ambulatory Visit

## 2015-08-03 ENCOUNTER — Ambulatory Visit (HOSPITAL_COMMUNITY)
Admission: RE | Admit: 2015-08-03 | Discharge: 2015-08-03 | Disposition: A | Discharge status: Home / Self Care | Payer: PRIVATE HEALTH INSURANCE | Source: Ambulatory Visit | Attending: Internal Medicine | Admitting: Internal Medicine

## 2015-08-03 DIAGNOSIS — M7642 Tibial collateral bursitis [Pellegrini-Stieda], left leg: Secondary | ICD-10-CM | POA: Insufficient documentation

## 2015-08-03 DIAGNOSIS — M25462 Effusion, left knee: Secondary | ICD-10-CM | POA: Diagnosis not present

## 2015-08-03 DIAGNOSIS — M25862 Other specified joint disorders, left knee: Secondary | ICD-10-CM | POA: Insufficient documentation

## 2015-08-03 DIAGNOSIS — M1712 Unilateral primary osteoarthritis, left knee: Secondary | ICD-10-CM | POA: Insufficient documentation

## 2015-08-03 DIAGNOSIS — M2342 Loose body in knee, left knee: Secondary | ICD-10-CM | POA: Diagnosis not present

## 2015-08-03 DIAGNOSIS — G8929 Other chronic pain: Secondary | ICD-10-CM | POA: Diagnosis present

## 2015-08-03 DIAGNOSIS — M948X6 Other specified disorders of cartilage, lower leg: Secondary | ICD-10-CM | POA: Insufficient documentation

## 2015-08-03 DIAGNOSIS — M25562 Pain in left knee: Secondary | ICD-10-CM | POA: Diagnosis not present

## 2015-09-02 ENCOUNTER — Non-Acute Institutional Stay (SKILLED_NURSING_FACILITY): Payer: PRIVATE HEALTH INSURANCE | Admitting: Internal Medicine

## 2015-09-02 ENCOUNTER — Encounter: Payer: Self-pay | Admitting: Internal Medicine

## 2015-09-02 DIAGNOSIS — S81001D Unspecified open wound, right knee, subsequent encounter: Secondary | ICD-10-CM | POA: Diagnosis not present

## 2015-09-02 DIAGNOSIS — S82831E Other fracture of upper and lower end of right fibula, subsequent encounter for open fracture type I or II with routine healing: Secondary | ICD-10-CM

## 2015-09-02 DIAGNOSIS — I2699 Other pulmonary embolism without acute cor pulmonale: Secondary | ICD-10-CM | POA: Diagnosis not present

## 2015-09-02 DIAGNOSIS — L0390 Cellulitis, unspecified: Secondary | ICD-10-CM

## 2015-09-02 DIAGNOSIS — D62 Acute posthemorrhagic anemia: Secondary | ICD-10-CM
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
## 2015-09-16 ENCOUNTER — Encounter: Payer: Self-pay | Admitting: Internal Medicine

## 2015-09-16 NOTE — Progress Notes (Deleted)
Patient ID: Pamela Trujillo, female   DOB: [birth year], 35 y.o.   MRN: [id number]  Location:  [facility]   Place of Service:  SNF [phone])  Provider: ***  PCP: No PCP Per Patient Patient Care Team: No Pcp Per Patient as PCP - General (General Practice)  Extended Emergency Contact Information Primary Emergency Contact: Szafran,Mary Address: [address]          [place] [address] [place] Home Phone: [phone] Mobile Phone: [phone] Relation: Mother  Code Status: *** Goals of care:  Advanced Directive information Advanced Directives 09/16/2015  Does patient have an advance directive? Yes  Type of Advance Directive Living will  Does patient want to make changes to advanced directive? No - Patient declined  Copy of advanced directive(s) in chart? Yes  Would patient like information on creating an advanced directive? -     No Known Allergies  Chief Complaint  Patient presents with  . Discharge Note    HPI:  35 y.o. female      Past Medical History  Diagnosis Date  . Morbid obesity [facility])     Past Surgical History  Procedure Laterality Date  . Cesarean section        reports that she has never smoked. She has never used smokeless tobacco. She reports that she does not drink alcohol or use illicit drugs. Social History   Social History  . Marital Status: Single    Spouse Name: N/A  . Number of Children: N/A  . Years of Education: N/A   Occupational History  . Not on file.   Social History Main Topics  . Smoking status: Never Smoker   . Smokeless tobacco: Never Used  . Alcohol Use: No  . Drug Use: No  . Sexual Activity: Yes    Birth Control/ Protection: Injection   Other Topics Concern  . Not on file   Social History Narrative   Functional Status Survey:    No Known Allergies  Pertinent  Health Maintenance Due  Topic Date Due  . PAP SMEAR  06/19/2014  . INFLUENZA VACCINE  08/25/2016 (Originally  12/28/2015)    Medications:   Medication List       This list is accurate as of: 09/16/15 10:23 AM.  Always use your most recent med list.               cyclobenzaprine 5 MG tablet  Commonly known as:  FLEXERIL  Take 5 mg by mouth 3 (three) times daily as needed for muscle spasms.     feeding supplement (PRO-STAT SUGAR FREE 64) Liqd  Take 30 mLs by mouth 3 (three) times daily with meals.     gabapentin 100 MG capsule  Commonly known as:  NEURONTIN  Take 200 mg by mouth 3 (three) times daily.     OXYCONTIN 10 mg 12 hr tablet  Generic drug:  oxyCODONE  Take 10 mg by mouth every 12 (twelve) hours.     oxyCODONE 5 MG immediate release tablet  Commonly known as:  Oxy IR/ROXICODONE  TAKE 1 TABLET BY MOUTH EVERY 4 HOURS AS NEEDED FOR MILD/MODERATE PAIN. TAKE 2 TABLETS BY MOUTH EVERY 4 HOURS AS NEEDED FOR SEVERE PAIN     polyethylene glycol packet  Commonly known as:  MIRALAX / GLYCOLAX  Take 17 g by mouth daily.     promethazine 25 MG tablet  Commonly known as:  PHENERGAN  Take 25 mg by mouth every 6 (  six) hours as needed for nausea or vomiting.     rivaroxaban 20 MG Tabs tablet  Commonly known as:  XARELTO  Take 20 mg by mouth daily.     senna 8.6 MG tablet  Commonly known as:  SENOKOT  Take 1 tablet by mouth at bedtime.     vitamin C 500 MG tablet  Commonly known as:  ASCORBIC ACID  Take 500 mg by mouth 2 (two) times daily.        Review of Systems  Filed Vitals:   09/16/15 0951  Pulse: 94  Temp: 97.2 F (36.2 C)  TempSrc: Oral  Resp: 18  Height: 5\' 3"  (1.6 m)  Weight: 296 lb (134.265 kg)   Body mass index is 52.45 kg/(m^2). Physical Exam  Labs reviewed: Basic Metabolic Panel:  Recent Labs  16/02/9601/27/17  NA 139  K 4.4  BUN 8  CREATININE 0.5   Liver Function Tests:  Recent Labs  07/26/15  AST 18  ALT 19  ALKPHOS 108   No results for input(s): LIPASE, AMYLASE in the last 8760 hours. No results for input(s): AMMONIA in the last 8760  hours. CBC:  Recent Labs  07/26/15  WBC 8.7  HGB 8.1*  HCT 26*  PLT 716*   Cardiac Enzymes: No results for input(s): CKTOTAL, CKMB, CKMBINDEX, TROPONINI in the last 8760 hours. BNP: Invalid input(s): POCBNP CBG: No results for input(s): GLUCAP in the last 8760 hours.  Procedures and Imaging Studies During Stay: No results found.  Assessment/Plan:   There are no diagnoses linked to this encounter.   Patient is being discharged with the following home health services:    Patient is being discharged with the following durable medical equipment:    Patient has been advised to f/u with their PCP in 1-2 weeks to bring them up to date on their rehab stay.  Social services at facility was responsible for arranging this appointment.  Pt was provided with a 30 day supply of prescriptions for medications and refills must be obtained from their PCP.  For controlled substances, a more limited supply may be provided adequate until PCP appointment only.  Future labs/tests needed:  ***

## 2015-09-22 ENCOUNTER — Encounter: Payer: Self-pay | Admitting: Internal Medicine

## 2015-09-22 NOTE — Progress Notes (Signed)
MRN: [id number] Name: Preslee Regas  Sex: female Age: 35 y.o. DOB: [birth year]  PSC #:  Facility/Room: Level Of Care: SNF Provider: Merrilee Seashore MD Emergency Contacts: Extended Emergency Contact Information Primary Emergency Contact: Barbary,Mary Address: [address]          [place] 09/09/1980 [place] Home Phone: [phone] Mobile Phone: [phone] Relation: Mother  Code Status: FullCode Allergies: Review of patient's allergies indicates no known allergies.  Chief Complaint  Patient presents with  . Discharge Note    HPI: Patient is 35 y.o. female who  Past Medical History  Diagnosis Date  . Morbid obesity [facility])     Past Surgical History  Procedure Laterality Date  . Cesarean section        Medication List       This list is accurate as of: 09/22/15 11:22 AM.  Always use your most recent med list.               cyclobenzaprine 5 MG tablet  Commonly known as:  FLEXERIL  Take 5 mg by mouth 3 (three) times daily as needed for muscle spasms.     feeding supplement (PRO-STAT SUGAR FREE 64) Liqd  Take 30 mLs by mouth 3 (three) times daily with meals.     gabapentin 100 MG capsule  Commonly known as:  NEURONTIN  Take 200 mg by mouth 3 (three) times daily.     OXYCONTIN 10 mg 12 hr tablet  Generic drug:  oxyCODONE  Take 10 mg by mouth every 12 (twelve) hours.     oxyCODONE 5 MG immediate release tablet  Commonly known as:  Oxy IR/ROXICODONE  TAKE 1 TABLET BY MOUTH EVERY 4 HOURS AS NEEDED FOR MILD/MODERATE PAIN. TAKE 2 TABLETS BY MOUTH EVERY 4 HOURS AS NEEDED FOR SEVERE PAIN     polyethylene glycol packet  Commonly known as:  MIRALAX / GLYCOLAX  Take 17 g by mouth daily.     promethazine 25 MG tablet  Commonly known as:  PHENERGAN  Take 25 mg by mouth every 6 (six) hours as needed for nausea or vomiting.     rivaroxaban 20 MG Tabs tablet  Commonly known as:  XARELTO  Take 20 mg by mouth daily.     senna 8.6 MG tablet   Commonly known as:  SENOKOT  Take 1 tablet by mouth at bedtime.     vitamin C 500 MG tablet  Commonly known as:  ASCORBIC ACID  Take 500 mg by mouth 2 (two) times daily.        No orders of the defined types were placed in this encounter.    Immunization History  Administered Date(s) Administered  . PPD Test 07/23/2015, 08/13/2015    Social History  Substance Use Topics  . Smoking status: Never Smoker   . Smokeless tobacco: Never Used  . Alcohol Use: No    Filed Vitals:   09/22/15 1101  BP: 119/84  Pulse: 84  Temp: 97.2 F (36.2 C)  Resp: 18    Physical Exam  GENERAL APPEARANCE: Alert, conversant. No acute distress.  HEENT: Unremarkable. RESPIRATORY: Breathing is even, unlabored. Lung sounds are clear   CARDIOVASCULAR: Heart RRR no murmurs, rubs or gallops. No peripheral edema.  GASTROINTESTINAL: Abdomen is soft, non-tender, not distended w/ normal bowel sounds.  NEUROLOGIC: Cranial nerves 2-12 grossly intact. Moves all extremities  Patient Active Problem List   Diagnosis Date Noted  . Open fracture of right distal femur (HCC) 07/25/2015  .  Open wound of right knee with complication 07/25/2015  . E coli bacteremia 07/25/2015  . PE (pulmonary thromboembolism) (HCC) 07/25/2015  . Pain management 07/25/2015  . Hemolytic transfusion reaction 07/25/2015  . Acute respiratory insufficiency 07/25/2015  . Acute blood loss anemia 07/25/2015  . Constipation 07/25/2015  . Open fracture of upper end of fibula 07/25/2015  . Closed fracture of transverse process of lumbar vertebra (HCC) 07/25/2015  . Morbid obesity (HCC)     CBC    Component Value Date/Time   WBC 8.7 07/26/2015   HGB 8.1* 07/26/2015   HCT 26* 07/26/2015   PLT 716* 07/26/2015    CMP     Component Value Date/Time   NA 139 07/26/2015   K 4.4 07/26/2015   BUN 8 07/26/2015   CREATININE 0.5 07/26/2015   AST 18 07/26/2015   ALT 19 07/26/2015   ALKPHOS 108 07/26/2015    Assessment and  Plan  No problem-specific assessment & plan notes found for this encounter.   [name],Anne  MD

## 2015-09-22 NOTE — Progress Notes (Deleted)
This encounter was created in error - please disregard.

## 2015-09-22 NOTE — Progress Notes (Signed)
This encounter was created in error - please disregard.  This encounter was created in error - please disregard.

## 2015-10-02 NOTE — Progress Notes (Signed)
This encounter was created in error - please disregard.

## 2015-10-06 ENCOUNTER — Encounter: Payer: Self-pay | Admitting: Internal Medicine

## 2015-10-06 ENCOUNTER — Non-Acute Institutional Stay (SKILLED_NURSING_FACILITY): Payer: PRIVATE HEALTH INSURANCE | Admitting: Internal Medicine

## 2015-10-06 DIAGNOSIS — S72401E Unspecified fracture of lower end of right femur, subsequent encounter for open fracture type I or II with routine healing: Secondary | ICD-10-CM | POA: Diagnosis not present

## 2015-10-06 DIAGNOSIS — S81001D Unspecified open wound, right knee, subsequent encounter: Secondary | ICD-10-CM

## 2015-10-06 DIAGNOSIS — S32009D Unspecified fracture of unspecified lumbar vertebra, subsequent encounter for fracture with routine healing: Secondary | ICD-10-CM

## 2015-10-06 DIAGNOSIS — D62 Acute posthemorrhagic anemia: Secondary | ICD-10-CM | POA: Diagnosis not present

## 2015-10-06 DIAGNOSIS — K5901 Slow transit constipation: Secondary | ICD-10-CM

## 2015-10-06 DIAGNOSIS — I2699 Other pulmonary embolism without acute cor pulmonale: Secondary | ICD-10-CM | POA: Diagnosis not present

## 2015-10-06 DIAGNOSIS — R7881 Bacteremia: Secondary | ICD-10-CM | POA: Diagnosis not present

## 2015-10-06 DIAGNOSIS — S82831E Other fracture of upper and lower end of right fibula, subsequent encounter for open fracture type I or II with routine healing: Secondary | ICD-10-CM | POA: Diagnosis not present

## 2015-10-06 DIAGNOSIS — T80919D Hemolytic transfusion reaction, unspecified incompatibility, unspecified as acute or delayed, subsequent encounter: Secondary | ICD-10-CM

## 2015-10-06 NOTE — Progress Notes (Signed)
MRN: [id number] Name: Pamela Trujillo  Sex: female Age: 35 y.o. DOB: 01/03/1981  [facility] #: [facility] Facility/Room:106-P Level Of Care: SNF Provider:[name], [name] MD Emergency Contacts: Extended Emergency Contact Information Primary Emergency Contact: Sheperd,Mary Address[address]          [place] [id number] [place] Home Phone: [phone] Mobile Phone: [phone] Relation: Mother  Code Status: FullCode   Allergies: Review of patient's allergies indicates no known allergies.  Chief Complaint  Patient presents with  . Discharge Note    HPI: Patient is 35 y.o. female who Pt was [facility] from [facility] for treatment of the following injuries: Closed fracture of transverse process of lumbar vertebra (HCC) 07/04/2015  Open fracture of distal end of right femur (HCC) 07/04/2015  Open wound of right knee with complication 07/04/2015  Closed fracture of proximal end of right fibula 07/04/2015  Open fracture of proximal end of right fibula         She developed a PE while hospitalized and was started on xarelto. Sh was admitted to SNF for OT/PT and for wound care. Wounds have all healed except for the one R post knee and it is almost completely healed. Pt can now walk enough steps to get around her house.   Past Medical History  Diagnosis Date  . Morbid obesity [facility](HCC)     Past Surgical History  Procedure Laterality Date  . Cesarean section        Medication List       This list is accurate as of: 10/06/15  3:23 PM.  Always use your most recent med list.               cyclobenzaprine 5 MG tablet  Commonly known as:  FLEXERIL  Take 5 mg by mouth 3 (three) times daily as needed for muscle spasms.     feeding supplement (PRO-STAT SUGAR FREE 64) Liqd  Take 30 mLs by mouth 3 (three) times daily with meals.     gabapentin 100 MG capsule  Commonly known as:  NEURONTIN  Take 200 mg by mouth 3 (three) times daily.     guaiFENesin  600 MG 12 hr tablet  Commonly known as:  MUCINEX  Take 600 mg by mouth 2 (two) times daily.     loratadine 10 MG tablet  Commonly known as:  CLARITIN  Take 10 mg by mouth daily.     OXYCONTIN 10 mg 12 hr tablet  Generic drug:  oxyCODONE  Take 10 mg by mouth every 12 (twelve) hours.     oxyCODONE 5 MG immediate release tablet  Commonly known as:  Oxy IR/ROXICODONE  TAKE 1 TABLET BY MOUTH EVERY 4 HOURS AS NEEDED FOR MILD/MODERATE PAIN. TAKE 2 TABLETS BY MOUTH EVERY 4 HOURS AS NEEDED FOR SEVERE PAIN     polyethylene glycol packet  Commonly known as:  MIRALAX / GLYCOLAX  Take 17 g by mouth daily.     promethazine 25 MG tablet  Commonly known as:  PHENERGAN  Take 25 mg by mouth every 6 (six) hours as needed for nausea or vomiting.     rivaroxaban 20 MG Tabs tablet  Commonly known as:  XARELTO  Take 20 mg by mouth daily.     senna 8.6 MG tablet  Commonly known as:  SENOKOT  Take 1 tablet by mouth at bedtime.     vitamin C 500 MG tablet  Commonly known as:  ASCORBIC ACID  Take 500 mg  by mouth 2 (two) times daily.        Meds ordered this encounter  Medications  . loratadine (CLARITIN) 10 MG tablet    Sig: Take 10 mg by mouth daily.  Marland Kitchen guaiFENesin (MUCINEX) 600 MG 12 hr tablet    Sig: Take 600 mg by mouth 2 (two) times daily.    Immunization History  Administered Date(s) Administered  . PPD Test 07/23/2015, 08/13/2015  . Tdap 07/04/2015    Social History  Substance Use Topics  . Smoking status: Never Smoker   . Smokeless tobacco: Never Used  . Alcohol Use: No    Filed Vitals:   10/06/15 1252  BP: 134/74  Pulse: 91  Temp: 98.8 F (37.1 C)  Resp: 20    Physical Exam  GENERAL APPEARANCE: Alert, conversant. No acute distress.  HEENT: Unremarkable. RESPIRATORY: Breathing is even, unlabored. Lung sounds are clear   CARDIOVASCULAR: Heart RRR no murmurs, rubs or gallops. No peripheral edema.  GASTROINTESTINAL: Abdomen is soft, non-tender, not distended w/  normal bowel sounds.  NEUROLOGIC: Cranial nerves 2-12 grossly intact. Moves all extremities  Patient Active Problem List   Diagnosis Date Noted  . Open fracture of right distal femur (HCC) 07/25/2015  . Open wound of right knee with complication 07/25/2015  . E coli bacteremia 07/25/2015  . PE (pulmonary thromboembolism) (HCC) 07/25/2015  . Pain management 07/25/2015  . Hemolytic transfusion reaction 07/25/2015  . Acute respiratory insufficiency 07/25/2015  . Acute blood loss anemia 07/25/2015  . Constipation 07/25/2015  . Open fracture of upper end of fibula 07/25/2015  . Closed fracture of transverse process of lumbar vertebra (HCC) 07/25/2015  . Morbid obesity (HCC)     CBC    Component Value Date/Time   WBC 8.7 07/26/2015   HGB 8.1* 07/26/2015   HCT 26* 07/26/2015   PLT 716* 07/26/2015    CMP     Component Value Date/Time   NA 139 07/26/2015   K 4.4 07/26/2015   BUN 8 07/26/2015   CREATININE 0.5 07/26/2015   AST 18 07/26/2015   ALT 19 07/26/2015   ALKPHOS 108 07/26/2015    Assessment and Plan  Pt is being d/c to her mother's house. She is being given to take home with her a walker and a bedside commode by the SNF. Medications have been reconciled and rx's have been written.   Time spent > 45 min;> 50% of time with patient was spent reviewing records, labs, tests and studies, counseling and developing plan of care  Merrilee Seashore  MD

## 2023-04-28 ENCOUNTER — Emergency Department (HOSPITAL_BASED_OUTPATIENT_CLINIC_OR_DEPARTMENT_OTHER): Payer: 59

## 2023-04-28 ENCOUNTER — Encounter (HOSPITAL_BASED_OUTPATIENT_CLINIC_OR_DEPARTMENT_OTHER): Payer: Self-pay

## 2023-04-28 ENCOUNTER — Emergency Department (HOSPITAL_BASED_OUTPATIENT_CLINIC_OR_DEPARTMENT_OTHER)
Admission: EM | Admit: 2023-04-28 | Discharge: 2023-04-28 | Disposition: A | Discharge status: Home / Self Care | Payer: 59

## 2023-04-28 ENCOUNTER — Other Ambulatory Visit: Payer: Self-pay

## 2023-04-28 DIAGNOSIS — M545 Low back pain, unspecified: Secondary | ICD-10-CM | POA: Insufficient documentation

## 2023-04-28 DIAGNOSIS — M542 Cervicalgia: Secondary | ICD-10-CM | POA: Diagnosis not present

## 2023-04-28 DIAGNOSIS — G8929 Other chronic pain: Secondary | ICD-10-CM | POA: Diagnosis not present

## 2023-04-28 DIAGNOSIS — Z7901 Long term (current) use of anticoagulants: Secondary | ICD-10-CM | POA: Insufficient documentation

## 2023-04-28 HISTORY — DX: Type 2 diabetes mellitus without complications: E11.9

## 2023-04-28 HISTORY — DX: Essential (primary) hypertension: I10

## 2023-04-28 LAB — PREGNANCY, URINE: Preg Test, Ur: NEGATIVE

## 2023-04-28 MED ORDER — OXYCODONE-ACETAMINOPHEN 5-325 MG PO TABS
2.0000 | ORAL_TABLET | Freq: Once | ORAL | Status: AC
  Administered 2023-04-28: 2 via ORAL
  Filled 2023-04-28: qty 2

## 2023-04-28 NOTE — ED Triage Notes (Signed)
Patient having lower back pain no injury.

## 2023-04-28 NOTE — ED Provider Notes (Signed)
[facility] [place] Provider Note   CSN: [id number] Arrival date & time: 04/28/23  1757     History Chief Complaint  Patient presents with   Back Pain    Pamela Trujillo is a 42 y.o. female.  Patient presents to the emergency department concerns of back pain.  Reports that she is having low back pain as well as some neck pain.  Reports that this is her chronic pain although this is aggravated and flared up.  Patient is currently taking Flexeril, gabapentin, for pain but states that this has not been improving her symptoms.  Denies any recent injury, falls, or other traumatic causes for this pain.  Denies other symptoms such as urinary concerns, no radiating pain into one lower extremity.  No unilateral weakness or numbness, saddle paresthesia, or loss of bowel or bladder control.  Denies history of IV drug use.   Back Pain      Home Medications Prior to Admission medications   Medication Sig Start Date End Date Taking? Authorizing Provider  Amino Acids-Protein Hydrolys (FEEDING SUPPLEMENT, PRO-STAT SUGAR FREE 64,) LIQD Take 30 mLs by mouth 3 (three) times daily with meals.    [provider]  cyclobenzaprine (FLEXERIL) 5 MG tablet Take 5 mg by mouth 3 (three) times daily as needed for muscle spasms.    [provider]  gabapentin (NEURONTIN) 100 MG capsule Take 200 mg by mouth 3 (three) times daily.    [provider]  loratadine (CLARITIN) 10 MG tablet Take 10 mg by mouth daily.    [provider]  oxycodone (OXY-IR) 5 MG capsule Take 5 mg by mouth every 6 (six) hours as needed for pain.    [provider]  polyethylene glycol (MIRALAX / GLYCOLAX) packet Take 17 g by mouth daily.    [provider]  promethazine (PHENERGAN) 25 MG tablet Take 25 mg by mouth every 6 (six) hours as needed for nausea or vomiting.    [provider]  rivaroxaban (XARELTO) 20 MG TABS tablet Take 20 mg by  mouth daily.    [provider]  senna (SENOKOT) 8.6 MG tablet Take 1 tablet by mouth at bedtime.     [provider]  vitamin C (ASCORBIC ACID) 500 MG tablet Take 500 mg by mouth 2 (two) times daily.    [provider]      Allergies    Patient has no known allergies.    Review of Systems   Review of Systems  Musculoskeletal:  Positive for back pain.  All other systems reviewed and are negative.   Physical Exam Updated Vital Signs BP 136/89   Pulse (!) 105   Temp 99.6 F (37.6 C) (Oral)   Resp 20   Ht 5\' 3"  (1.6 m)   Wt 135 kg   SpO2 100%   BMI 52.72 kg/m  Physical Exam Vitals and nursing note reviewed.  Constitutional:      General: She is not in acute distress.    Appearance: She is well-developed.  HENT:     Head: Normocephalic and atraumatic.  Eyes:     Conjunctiva/sclera: Conjunctivae normal.  Cardiovascular:     Rate and Rhythm: Normal rate and regular rhythm.     Heart sounds: No murmur heard. Pulmonary:     Effort: Pulmonary effort is normal. No respiratory distress.     Breath sounds: Normal breath sounds.  Abdominal:     Palpations: Abdomen is soft.  Tenderness: There is no abdominal tenderness.  Musculoskeletal:        General: Tenderness present. No swelling.     Cervical back: Neck supple.     Comments: TTP in lumbar spine slightly towards right paraspinal muscles. Straight leg raise unremarkable.  Skin:    General: Skin is warm and dry.     Capillary Refill: Capillary refill takes less than 2 seconds.  Neurological:     Mental Status: She is alert.  Psychiatric:        Mood and Affect: Mood normal.     ED Results / Procedures / Treatments   Labs (all labs ordered are listed, but only abnormal results are displayed) Labs Reviewed  PREGNANCY, URINE    EKG None  Radiology DG Lumbar Spine Complete  Result Date: 04/28/2023 CLINICAL DATA:  Chronic neck and back pain. EXAM: LUMBAR SPINE - COMPLETE 4+ VIEW;  CERVICAL SPINE - COMPLETE 4+ VIEW COMPARISON:  None Available. FINDINGS: Cervical spine series routine five views plus swimmer's lateral: The cervical alignment is normal. Technical limitation due to overlying hair braids. There is preservation of the normal cervical disc heights. There is no evidence of fractures. Arthritic changes are not seen but there is mild spondylosis with small anterior endplate spurs. The bony foramina are patent. No precervical soft tissue thickening is seen. Lung apices are clear. Lumbar spine, routine five views: There is normal bone mineralization and alignment with no evidence of lumbar fractures. The vertebra are normal in heights. The disc spaces are normal in heights except for mild disc narrowing at the lowest 2 levels. There are progressively bulky anterior marginal osteophytes with attempted bridging from L2-3 down, greatest at L4-5 and L5-S1. Additionally there is moderate facet hypertrophy at L4-5 and L5-S1 and mild facet hypertrophy at L3-4. There is pelvic enthesopathy. Bridging osteophytes both SI joints. IUD in the pelvis. No nephrolithiasis. IMPRESSION: 1. Mild cervical spondylosis. No evidence of cervical fractures or malalignment. 2. No evidence of lumbar fractures or malalignment. 3. Degenerative changes in the lumbar spine as described above. 4. Pelvic enthesopathy. 5. Bridging osteophytes both superior SI joints. Electronically Signed   By: Almira Bar M.D.   On: 04/28/2023 21:30   DG Cervical Spine Complete  Result Date: 04/28/2023 CLINICAL DATA:  Chronic neck and back pain. EXAM: LUMBAR SPINE - COMPLETE 4+ VIEW; CERVICAL SPINE - COMPLETE 4+ VIEW COMPARISON:  None Available. FINDINGS: Cervical spine series routine five views plus swimmer's lateral: The cervical alignment is normal. Technical limitation due to overlying hair braids. There is preservation of the normal cervical disc heights. There is no evidence of fractures. Arthritic changes are not seen but  there is mild spondylosis with small anterior endplate spurs. The bony foramina are patent. No precervical soft tissue thickening is seen. Lung apices are clear. Lumbar spine, routine five views: There is normal bone mineralization and alignment with no evidence of lumbar fractures. The vertebra are normal in heights. The disc spaces are normal in heights except for mild disc narrowing at the lowest 2 levels. There are progressively bulky anterior marginal osteophytes with attempted bridging from L2-3 down, greatest at L4-5 and L5-S1. Additionally there is moderate facet hypertrophy at L4-5 and L5-S1 and mild facet hypertrophy at L3-4. There is pelvic enthesopathy. Bridging osteophytes both SI joints. IUD in the pelvis. No nephrolithiasis. IMPRESSION: 1. Mild cervical spondylosis. No evidence of cervical fractures or malalignment. 2. No evidence of lumbar fractures or malalignment. 3. Degenerative changes in the lumbar spine as described  above. 4. Pelvic enthesopathy. 5. Bridging osteophytes both superior SI joints. Electronically Signed   By: Almira Bar M.D.   On: 04/28/2023 21:30    Procedures Procedures   Medications Ordered in ED Medications  oxyCODONE-acetaminophen (PERCOCET/ROXICET) 5-325 MG per tablet 2 tablet (2 tablets Oral Given 04/28/23 2001)    ED Course/ Medical Decision Making/ A&P                               Medical Decision Making Amount and/or Complexity of Data Reviewed Labs: ordered. Radiology: ordered.  Risk Prescription drug management.   This patient presents to the ED for concern of back pain.  Differential diagnosis includes lumbar radiculopathy, cauda equina syndrome, lumbar strain, cervical strain   Imaging Studies ordered:  I ordered imaging studies including x-ray lumbar spine, x-ray cervical spine I independently visualized and interpreted imaging which showed 1. Mild cervical spondylosis. No evidence of cervical fractures or malalignment. 2. No evidence  of lumbar fractures or malalignment. 3. Degenerative changes in the lumbar spine as described above. 4. Pelvic enthesopathy. 5. Bridging osteophytes both superior SI joints. I agree with the radiologist interpretation   Medicines ordered and prescription drug management:  I ordered medication including Percocet for pain Reevaluation of the patient after these medicines showed that the patient improved I have reviewed the patients home medicines and have made adjustments as needed   Problem List / ED Course:  Patient presents the emergency department concerns of back pain.  Reports that she has chronic low back pain that is currently being managed by pain specialist.  States that she has not sustained any recent injury or is any acute worsening in type of pain although reports that she is not able to manage the pain at this time with her current home medication which includes gabapentin and Flexeril.  Denies saddle paresthesia, bowel or bladder incontinence, or unilateral leg weakness or numbness.  X-ray imaging obtained given significant pain although appears that this is likely just aggravated chronic pain.  Dose of Percocet given for pain control. X-ray imaging is large unremarkable does show some mild cervical spondylosis but no acute findings.  Again believe this is all chronic pain in nature and has been aggravated.  Encourage patient to reach out to pain management clinic for further monitoring and treatment of this pain.  No evidence of cauda equina syndrome or concerns for spinal abscess.  Discussed return precautions with patient.  Patient otherwise agreeable with current plan and discharged home in stable condition.  Final Clinical Impression(s) / ED Diagnoses Final diagnoses:  Chronic bilateral low back pain without sciatica  Neck pain    Rx / DC Orders ED Discharge Orders     None         Smitty Knudsen, PA-C 04/29/23 0001    Coral Spikes, DO 04/29/23 1456

## 2023-04-28 NOTE — Discharge Instructions (Signed)
You are seen in the emergency department today with concerns of back pain.  Direct imaging appears to show some mild degeneration but no acute findings.  I would recommend following up with the pain management group for further evaluation and treatment of this pain.  If symptoms worsen or new symptoms arise such as groin numbness, loss of bowel or bladder control, or weakness or numbness in one leg, return to the emergency department.
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
